# Patient Record
Sex: Female | Born: 1995 | Race: White | Hispanic: No | Marital: Single | Smoking: Never smoker
Health system: Southern US, Community
[De-identification: ages and names within clinical notes are randomized; demographics above are authoritative.]

## PROBLEM LIST (undated history)

## (undated) DIAGNOSIS — J45909 Unspecified asthma, uncomplicated: Secondary | ICD-10-CM

## (undated) HISTORY — PX: NO SIGNIFICANT SURGICAL HISTORY: 1000005

---
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
## 2015-08-23 IMAGING — CR DG CHEST 2V
2 series · 2 of 2 positions shown · non-contrast
Comparison: None.

CLINICAL DATA: Productive cough

EXAM:
CHEST  2 VIEW

[view not recorded (1 of 2)]
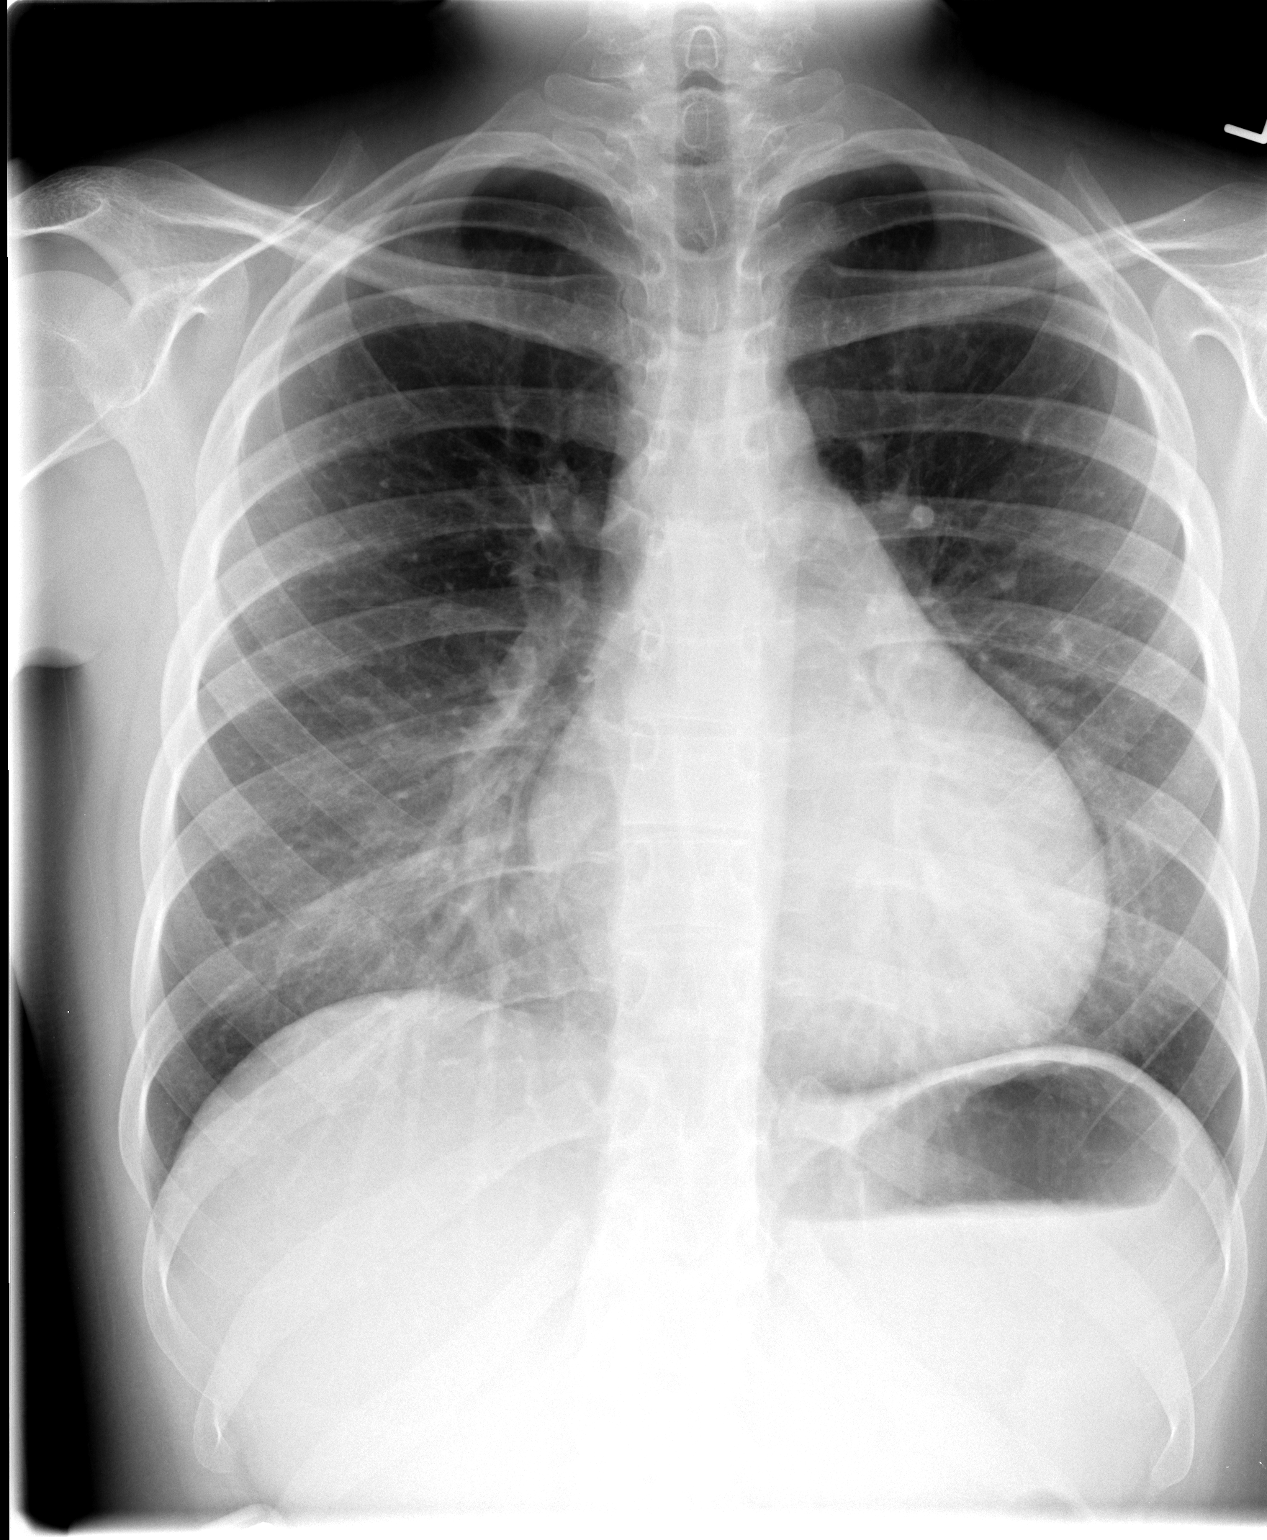

[view not recorded (2 of 2)]
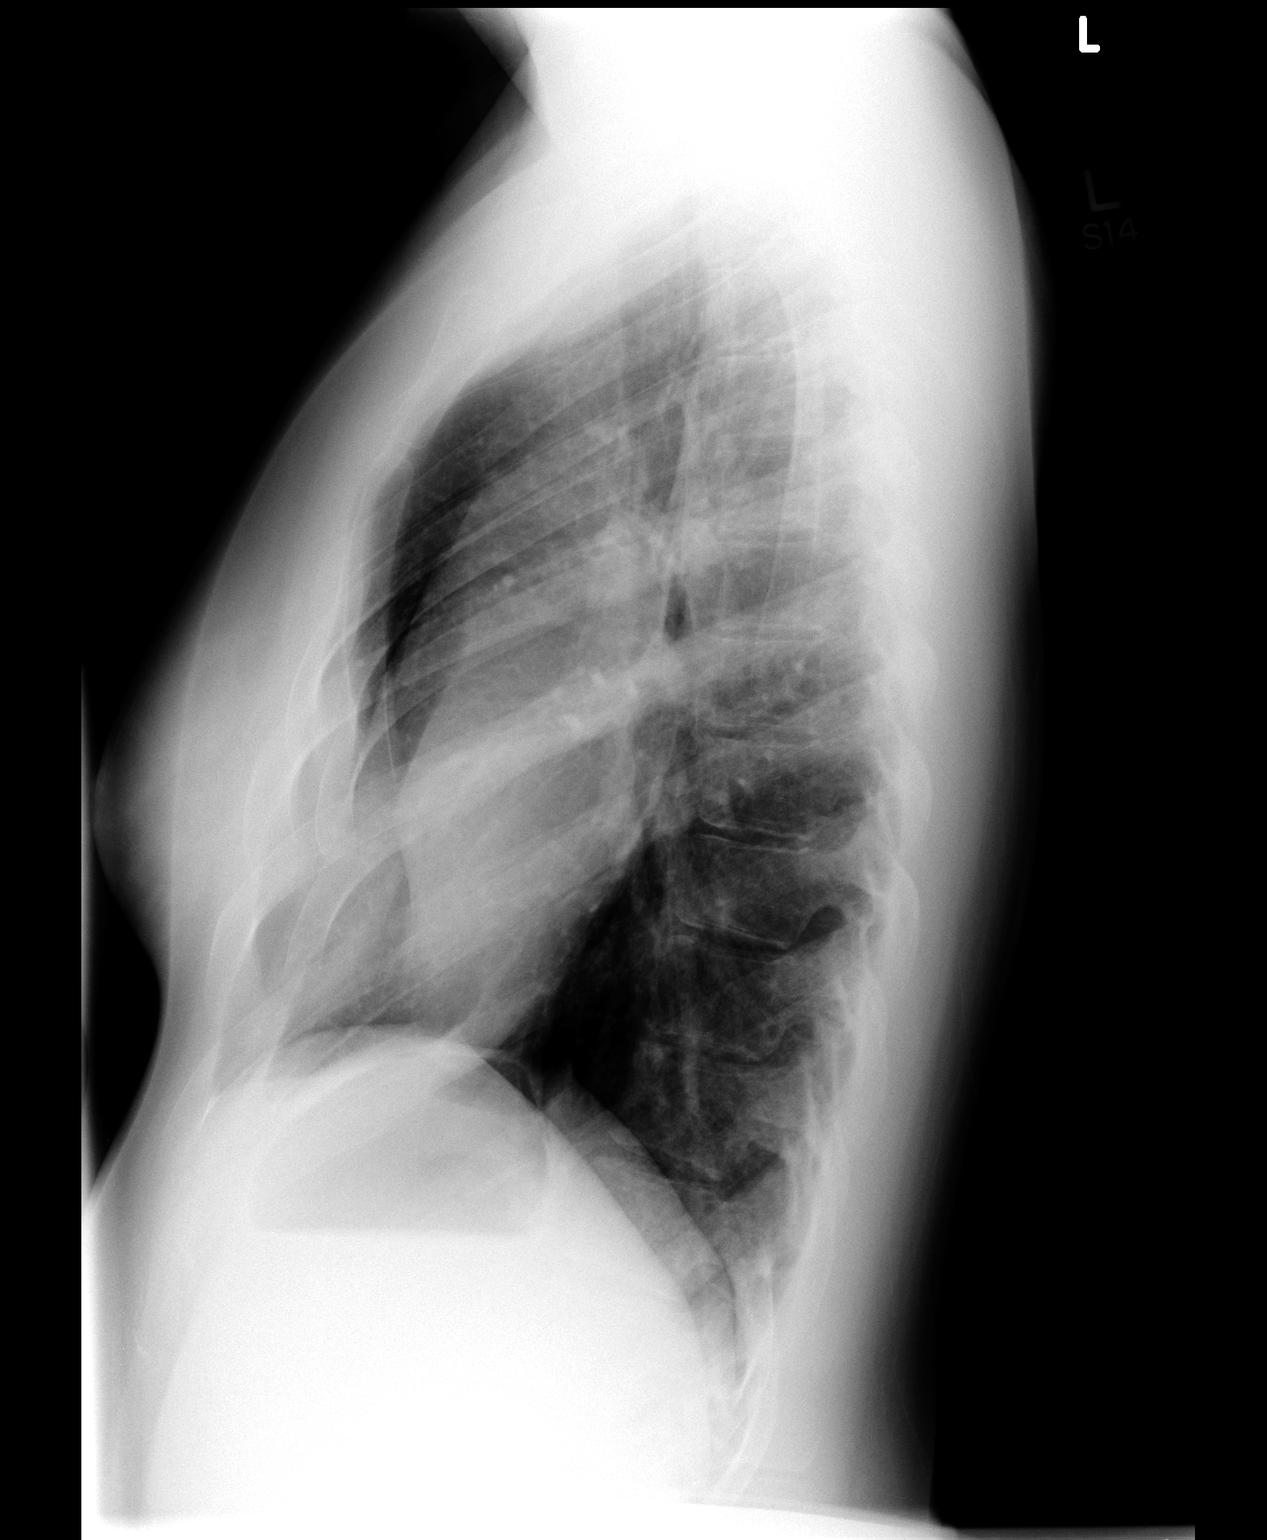

[2 of 2 positions shown; findings below may reference images not displayed]

FINDINGS: Normal cardiac silhouette. There is subtle right middle lobe
opacity. No pleural fluid. No pneumothorax. No osseous abnormality.
IMPRESSION: Right middle lobe pneumonia versus atelectasis.

## 2016-05-13 ENCOUNTER — Encounter (HOSPITAL_COMMUNITY): Payer: Self-pay | Admitting: Emergency Medicine

## 2016-05-13 ENCOUNTER — Ambulatory Visit (HOSPITAL_COMMUNITY)
Admission: EM | Admit: 2016-05-13 | Discharge: 2016-05-13 | Disposition: A | Discharge status: Home / Self Care | Payer: PRIVATE HEALTH INSURANCE

## 2016-05-13 DIAGNOSIS — J039 Acute tonsillitis, unspecified: Secondary | ICD-10-CM

## 2016-05-13 DIAGNOSIS — J069 Acute upper respiratory infection, unspecified: Secondary | ICD-10-CM

## 2016-05-13 MED ORDER — DEXAMETHASONE SODIUM PHOSPHATE 10 MG/ML IJ SOLN
INTRAMUSCULAR | Status: AC
  Filled 2016-05-13: qty 1

## 2016-05-13 MED ORDER — DEXAMETHASONE SODIUM PHOSPHATE 10 MG/ML IJ SOLN
10.0000 mg | Freq: Once | INTRAMUSCULAR | Status: AC
  Administered 2016-05-13: 10 mg via INTRAMUSCULAR

## 2016-05-13 MED ORDER — AMOXICILLIN 500 MG PO CAPS: 1000.0000 mg | ORAL_CAPSULE | Freq: Two times a day (BID) | ORAL | 0 refills | Status: AC

## 2016-05-13 NOTE — ED Provider Notes (Signed)
CSN: [id number]     Arrival date & time 05/13/16  1452 History   First MD Initiated Contact with Patient 05/13/16 1532     Chief Complaint  Patient presents with  . URI   (Consider location/radiation/quality/duration/timing/severity/associated sxs/prior Treatment) 20 y.o. female presents with fever, headache, enlarged lymph nodes and sore throat X 1.5 weeks.  Condition is acute in nature. Condition is made better by nothing. Condition is made worse by nothing. Patient reports she was on antibiotics and the condition improved but she had a sudden onset of symptoms 2 days after completion       History reviewed. No pertinent past medical history. History reviewed. No pertinent surgical history. History reviewed. No pertinent family history. Social History  Substance Use Topics  . Smoking status: Never Smoker  . Smokeless tobacco: Never Used  . Alcohol use Yes   OB History    No data available     Review of Systems  Constitutional: Positive for fever.  HENT: Positive for sinus pressure and sore throat.   Respiratory: Positive for cough ( resolved).     Allergies  Patient has no known allergies.  Home Medications   Prior to Admission medications   Medication Sig Start Date End Date Taking? Authorizing Provider  escitalopram (LEXAPRO) 10 MG tablet Take 10 mg by mouth daily.   Yes Historical Provider, MD  fluticasone (FLONASE) 50 MCG/ACT nasal spray Place into both nostrils daily.   Yes Historical Provider, MD  hydrOXYzine (ATARAX/VISTARIL) 10 MG tablet Take 10 mg by mouth 3 (three) times daily as needed.   Yes Historical Provider, MD  Melatonin-Pyridoxine (MELATIN PO) Take by mouth.   Yes Historical Provider, MD  methylphenidate 18 MG PO CR tablet Take 18 mg by mouth daily.   Yes Historical Provider, MD  predniSONE (DELTASONE) 10 MG tablet Take 3 tablets (30 mg total) by mouth daily. 10/16/13  Yes Rodolph BongEvan S Corey, MD  amoxicillin (AMOXIL) 500 MG capsule Take 2 capsules (1,000  mg total) by mouth 2 (two) times daily. 05/13/16   Alene MiresJennifer C Valor [place], NP  azithromycin (ZITHROMAX) 250 MG tablet Take 1 tablet (250 mg total) by mouth daily. Take first 2 tablets together, then 1 every day until finished. 10/16/13   Rodolph BongEvan S Corey, MD   Meds Ordered and Administered this Visit   Medications  dexamethasone (DECADRON) injection 10 mg (not administered)    BP 116/75 (BP Location: Right Arm)   Pulse 87   Temp 99.2 F (37.3 C) (Oral)   Resp 16   SpO2 96%  No data found.   Physical Exam  Constitutional: She is oriented to person, place, and time. She appears well-developed and well-nourished.  HENT:  Head: Normocephalic and atraumatic.  Right Ear: External ear normal.  Left Ear: External ear normal.  Mouth/Throat: Oropharynx is clear and moist.  Eyes: Conjunctivae are normal.  Neck: Normal range of motion.  Cardiovascular: Normal rate and regular rhythm.   Pulmonary/Chest: Effort normal and breath sounds normal.  Neurological: She is alert and oriented to person, place, and time.  Psychiatric: She has a normal mood and affect.  Nursing note and vitals reviewed.   Urgent Care Course   Clinical Course     Procedures (including critical care time)  Labs Review Labs Reviewed - No data to display  Imaging Review No results found.      MDM   1. Acute tonsillitis, unspecified etiology        Alene MiresJennifer C Alyria Krack, NP 05/13/16 1547  Alene MiresJennifer C Lizbeth Feijoo, NP 05/13/16 [phone]

## 2016-05-13 NOTE — Discharge Instructions (Signed)
Continue to push fluids and take over the counter medications as directed on the back of the box for symptomatic relief.  ° °

## 2016-05-13 NOTE — ED Triage Notes (Signed)
Here for cold sx onset last night associated w/fevers, chills, submandibular swelling, ST, dizziness, nauseas  Finished prednisone and doxycyline given by [facility] earlier this week   A&O x4... NAD

## 2019-09-04 LAB — COVID-19 ANTIGEN CARE EVERYWHERE
COVID-19 ANTIGEN CARE EVERWHERE: NEGATIVE
LOT NUMBER CARE EVERYWHERE: 60082

## 2019-09-17 LAB — COVID-19 ANTIGEN CARE EVERYWHERE
COVID-19 ANTIGEN CARE EVERWHERE: NEGATIVE
LOT NUMBER CARE EVERYWHERE: 6000129

## 2020-03-27 LAB — COVID-19 CARE EVERYWHERE: COVID-19 CARE EVERYWHERE: NOT DETECTED

## 2020-06-22 LAB — HEMOGLOBIN A1C CARE EVERYWHERE
HEMOGLOBIN A1C CARE EVERYWHERE: 5.1 % (ref 4.2–5.6)
MEAN BLOOD GLUCOSE CARE EVERYWHERE: 100 mg/dL

## 2020-10-04 LAB — COVID-19 ANTIGEN CARE EVERYWHERE
COVID-19 ANTIGEN CARE EVERWHERE: NEGATIVE
LOT NUMBER CARE EVERYWHERE: 7000039

## 2021-09-12 ENCOUNTER — Other Ambulatory Visit: Payer: Self-pay

## 2021-09-12 ENCOUNTER — Encounter (HOSPITAL_BASED_OUTPATIENT_CLINIC_OR_DEPARTMENT_OTHER): Payer: Self-pay

## 2021-09-12 ENCOUNTER — Emergency Department
Admission: EM | Admit: 2021-09-12 | Discharge: 2021-09-12 | Disposition: A | Discharge status: Home / Self Care | Payer: BC Managed Care – PPO | Attending: Emergency Medicine | Admitting: Emergency Medicine

## 2021-09-12 DIAGNOSIS — R42 Dizziness and giddiness: Secondary | ICD-10-CM | POA: Diagnosis present

## 2021-09-12 HISTORY — DX: Unspecified asthma, uncomplicated: J45.909

## 2021-09-12 LAB — CBC, PLATELET & DIFFERENTIAL
ABSOLUTE BASO COUNT: 0 10*3/uL (ref 0.0–0.1)
ABSOLUTE EOSINOPHIL COUNT: 0.1 10*3/uL (ref 0.0–0.8)
ABSOLUTE IMM GRAN COUNT: 0.02 10*3/uL (ref 0.00–0.10)
ABSOLUTE LYMPH COUNT: 1.5 10*3/uL (ref 0.6–5.9)
ABSOLUTE MONO COUNT: 0.6 10*3/uL (ref 0.2–1.4)
ABSOLUTE NEUTROPHIL COUNT: 5.3 10*3/uL (ref 1.6–8.3)
ABSOLUTE NRBC COUNT: 0 10*3/uL (ref 0.0–0.0)
BASOPHIL %: 0.4 % (ref 0.0–1.2)
EOSINOPHIL %: 0.8 % (ref 0.0–7.0)
HEMATOCRIT: 37.9 % (ref 34.1–44.9)
HEMOGLOBIN: 12.4 g/dL (ref 11.2–15.7)
IMMATURE GRANULOCYTE %: 0.3 % (ref 0.0–1.0)
LYMPHOCYTE %: 20 % (ref 15.0–54.0)
MEAN CORP HGB CONC: 32.7 g/dL (ref 31.0–37.0)
MEAN CORPUSCULAR HGB: 30.9 pg (ref 26.0–34.0)
MEAN CORPUSCULAR VOL: 94.5 fl (ref 80.0–100.0)
MEAN PLATELET VOLUME: 10.3 fL (ref 8.7–12.5)
MONOCYTE %: 7.5 % (ref 4.0–13.0)
NEUTROPHIL %: 71 % (ref 40.0–75.0)
NRBC %: 0 % (ref 0.0–0.0)
PLATELET COUNT: 190 10*3/uL (ref 150–400)
RBC DISTRIBUTION WIDTH STD DEV: 42.1 fL (ref 35.1–46.3)
RED BLOOD CELL COUNT: 4.01 M/uL (ref 3.90–5.20)
WHITE BLOOD CELL COUNT: 7.5 10*3/uL (ref 4.0–11.0)

## 2021-09-12 LAB — COMPREHENSIVE METABOLIC PANEL
ALANINE AMINOTRANSFERASE: 13 U/L (ref 12–45)
ALBUMIN: 4.5 g/dL (ref 3.4–5.2)
ALKALINE PHOSPHATASE: 69 U/L (ref 45–117)
ANION GAP: 8 mmol/L — ABNORMAL LOW (ref 10–22)
ASPARTATE AMINOTRANSFERASE: 14 U/L (ref 8–34)
BILIRUBIN TOTAL: 0.4 mg/dL (ref 0.2–1.0)
BUN (UREA NITROGEN): 9 mg/dL (ref 7–18)
CALCIUM: 9.6 mg/dL (ref 8.5–10.5)
CARBON DIOXIDE: 26 mmol/L (ref 21–32)
CHLORIDE: 107 mmol/L (ref 98–107)
CREATININE: 0.7 mg/dL (ref 0.4–1.2)
ESTIMATED GLOMERULAR FILT RATE: >60 mL/min (ref >60)
Glucose Random: 123 mg/dL (ref 74–160)
POTASSIUM: 3.9 mmol/L (ref 3.5–5.1)
SODIUM: 141 mmol/L (ref 136–145)
TOTAL PROTEIN: 7.4 g/dL (ref 6.4–8.2)

## 2021-09-12 LAB — URINE PREGNANCY TEST (POINT OF CARE): HCG QUALITATIVE URINE: NEGATIVE

## 2021-09-12 MED ORDER — SODIUM CHLORIDE 0.9 % IV BOLUS
1000.0000 mL | Freq: Once | INTRAVENOUS | Status: AC
Start: 2021-09-12 — End: 2021-09-12
  Administered 2021-09-12: 1000 mL via INTRAVENOUS

## 2021-09-12 NOTE — ED Provider Notes (Signed)
HPI:  This 26 year old female patient presented to [facility] with chief complaint of Dizziness      Triage Documentation     [place] Katsios-Gray, RN 09/12/2021 17:59             Pt self presents to ER c/o dizziness since testing Covid + on 3/3.  Was seen at [facility] on 3/15 and started on meclizine. Reports taking meds as prescribed with minimal effect.  Denies chest pain, SOB or any other symptoms.          History was provided by the patient.  Healthy, 26 year old female presents with persistent dizziness.  Patient states that she tested positive for COVID-19 on March 3.  At that time, she had fevers, body aches, URI symptoms.  All of those symptoms have resolved.  However, for the past week she has had dizziness.  Reports a constant feeling of dizziness.  In the past, she has had vertigo which she described as room spinning.  Today's dizziness she describes as as if she is standing on a dock or boat, and feels slightly unsteady.  She has not fallen.  She has an IUD for birth control, does not take any exogenous estrogens.  She had a headache last night, which is not atypical for her, and is since entirely resolved.  She has no chest pain, shortness of breath.  No known family history of any VTE.      Past Medical History:  Past Medical History:  No date: Asthma  Past Surgical History:   Past Surgical History:  No date: NO SIGNIFICANT SURGICAL HISTORY  Allergies:  Review of Patient's Allergies indicates:   Latex                   Itching      Physical Exam:  ED Triage Vitals [09/12/21 1755]   ED Triage Vitals Brief Group      Temp 98.1 F      Pulse 85      Resp 18      BP 130/85      SpO2 98 %      Pain Score 0        General: Patient is awake and alert, resting comfortably in no acute distress  Head: Normocephalic and atraumatic  Ear, nose, throat: Normal external exam  Eyes: PERRL, EOMI  Neck: Normal range of motion  Respiratory: No respiratory distress  GI: abdomen is nondistended  Extremities:  Normal range of motion  Neuro: The patient is alert and oriented to person, place, and time.  Normal cranial nerves.  Normal finger-to-nose.  No nystagmus.  No pronator drift.  Negative Romberg.  Steady gait, steady tandem gait.    Psych: Calm, cooperative, normal affect        Medications Given in the ED:    Medications   sodium chloride 0.9 % IV bolus 1,000 mL (0 mLs Intravenous Stopped 09/12/21 2034)       Results:     Labs Reviewed   COMPREHENSIVE METABOLIC PANEL - Abnormal; Notable for the following components:       Result Value    ANION GAP 8 (*)     All other components within normal limits   CBC, PLATELET & DIFFERENTIAL   URINE PREGNANCY TEST (POINT OF CARE)     No orders to display            While in the ED patient received:  There are no discharge medications for this patient.      Patient Vitals for the past 24 hrs:   BP Temp Pulse Resp SpO2   09/12/21 1755 130/85 98.1 F 85 18 98 %       ED Course and Medical Decision-making:    The patient is a 26 year old female with above history who presents to the Emergency Department with persistent dizziness since COVID-19 2 weeks ago.  Patient is very well-appearing on my examination.  She has an entirely normal neurologic exam including cranial nerves, cerebellar testing, and gait.     Low suspicion for intracranial process given normal neurologic exam.  The patient is young, has no known risk factors for stroke.  Is not on any exogenous estrogens.    Plan for labs to exclude electrolyte abnormality, anemia, ECG, and pregnancy test.    Anticipate discharge with outpatient primary care follow-up.      ED Course as of 09/12/21 2351   Mon Sep 12, 2021   1903 My independent interpretation of the EKG:  75 bpm, NSR, normal intervals, no acute ischemic changes       Work-up in the emergency department was reassuring including EKG, negative pricey test, CBC, and metabolic panel.  Patient remains well-appearing.  Unclear cause of her symptoms, advised to follow-up  with her primary care physician.    Patient informed at bedside of their likely diagnosis.  Expectant management and return precautions were reviewed with them at bedside prior to discharge.     Disposition: Discharge    Patient Condition: Stable     Initial Impression:  Dizziness      Drema Balzarine, MD  [facility]  Attending Physician  Emergency Department      This Emergency Department patient encounter note was created using voice-recognition software and in real time during the ED visit. Please excuse any typographical errors that have not been edited out.

## 2021-09-12 NOTE — ED Triage Note (Signed)
Pt self presents to ER c/o dizziness since testing Covid + on 3/3.  Was seen at [facility] on 3/15 and started on meclizine. Reports taking meds as prescribed with minimal effect.  Denies chest pain, SOB or any other symptoms.

## 2021-09-12 NOTE — Narrator Note (Signed)
Patient Disposition  Patient education for diagnosis, medications, activity, diet and follow-up.  Patient left ED 8:34 PM.  Patient rep received written instructions.    Interpreter to provide instructions: No    Patient belongings with patient: YES    Have all existing LDAs been addressed? Yes    Have all IV infusions been stopped? Yes    Destination: Discharged to home  Pt given D/C instructions, voiced understanding.  Pt explained reasons for returning to ED.

## 2021-09-12 NOTE — Discharge Instructions (Addendum)
You are seen in the emergency department with dizziness.    You had an EKG, and blood test that did not show any concerning findings.    Please continue to drink plenty of fluids, take the meclizine as needed.    Please follow-up with your primary care physician.    Return to the emergency room if you develop worsening symptoms, if you pass out, develop numbness or weakness on one side your body, or with any new or concerning symptoms.

## 2021-09-13 LAB — EKG
